# Patient Record
Sex: Female | Born: 1965 | Race: White | Hispanic: No | Marital: Married | Smoking: Never smoker
Health system: Southern US, Community
[De-identification: ages and names within clinical notes are randomized; demographics above are authoritative.]

## PROBLEM LIST (undated history)

## (undated) DIAGNOSIS — J45909 Unspecified asthma, uncomplicated: Secondary | ICD-10-CM

## (undated) DIAGNOSIS — K5792 Diverticulitis of intestine, part unspecified, without perforation or abscess without bleeding: Secondary | ICD-10-CM

## (undated) HISTORY — PX: DILATION AND CURETTAGE OF UTERUS: SHX78

## (undated) HISTORY — PX: CHOLECYSTECTOMY: SHX55

---

## 2001-01-23 ENCOUNTER — Other Ambulatory Visit: Admission: RE | Admit: 2001-01-23 | Discharge: 2001-01-23 | Payer: Self-pay | Admitting: Obstetrics and Gynecology

## 2001-06-08 ENCOUNTER — Encounter: Payer: Self-pay | Admitting: Obstetrics and Gynecology

## 2001-06-08 ENCOUNTER — Inpatient Hospital Stay (HOSPITAL_COMMUNITY): Admission: AD | Admit: 2001-06-08 | Discharge: 2001-06-08 | Payer: Self-pay | Admitting: Obstetrics and Gynecology

## 2001-06-12 ENCOUNTER — Inpatient Hospital Stay (HOSPITAL_COMMUNITY): Admission: AD | Admit: 2001-06-12 | Discharge: 2001-06-12 | Payer: Self-pay | Admitting: Obstetrics & Gynecology

## 2001-06-19 ENCOUNTER — Encounter: Payer: Self-pay | Admitting: Obstetrics and Gynecology

## 2001-06-19 ENCOUNTER — Inpatient Hospital Stay (HOSPITAL_COMMUNITY): Admission: AD | Admit: 2001-06-19 | Discharge: 2001-06-19 | Payer: Self-pay | Admitting: Obstetrics and Gynecology

## 2001-06-19 ENCOUNTER — Encounter (HOSPITAL_COMMUNITY): Admission: RE | Admit: 2001-06-19 | Discharge: 2001-07-14 | Payer: Self-pay | Admitting: Obstetrics and Gynecology

## 2001-07-15 ENCOUNTER — Encounter (INDEPENDENT_AMBULATORY_CARE_PROVIDER_SITE_OTHER): Payer: Self-pay | Admitting: Specialist

## 2001-07-15 ENCOUNTER — Inpatient Hospital Stay (HOSPITAL_COMMUNITY): Admission: AD | Admit: 2001-07-15 | Discharge: 2001-07-18 | Payer: Self-pay | Admitting: Obstetrics and Gynecology

## 2001-08-15 ENCOUNTER — Other Ambulatory Visit: Admission: RE | Admit: 2001-08-15 | Discharge: 2001-08-15 | Payer: Self-pay | Admitting: Obstetrics and Gynecology

## 2001-10-13 ENCOUNTER — Ambulatory Visit (HOSPITAL_COMMUNITY): Admission: RE | Admit: 2001-10-13 | Discharge: 2001-10-13 | Payer: Self-pay | Admitting: Obstetrics and Gynecology

## 2001-10-13 ENCOUNTER — Encounter: Payer: Self-pay | Admitting: Obstetrics and Gynecology

## 2003-02-05 ENCOUNTER — Other Ambulatory Visit: Admission: RE | Admit: 2003-02-05 | Discharge: 2003-02-05 | Payer: Self-pay | Admitting: Obstetrics and Gynecology

## 2005-02-26 ENCOUNTER — Other Ambulatory Visit: Admission: RE | Admit: 2005-02-26 | Discharge: 2005-02-26 | Payer: Self-pay | Admitting: Obstetrics and Gynecology

## 2008-07-11 ENCOUNTER — Ambulatory Visit: Payer: Self-pay | Admitting: Family Medicine

## 2008-07-11 DIAGNOSIS — E785 Hyperlipidemia, unspecified: Secondary | ICD-10-CM | POA: Insufficient documentation

## 2008-07-11 DIAGNOSIS — R635 Abnormal weight gain: Secondary | ICD-10-CM | POA: Insufficient documentation

## 2008-07-11 DIAGNOSIS — N920 Excessive and frequent menstruation with regular cycle: Secondary | ICD-10-CM | POA: Insufficient documentation

## 2008-07-12 DIAGNOSIS — D509 Iron deficiency anemia, unspecified: Secondary | ICD-10-CM | POA: Insufficient documentation

## 2008-07-12 LAB — CONVERTED CEMR LAB
Basophils Absolute: 0 10*3/uL (ref 0.0–0.1)
Basophils Relative: 0 % (ref 0–1)
Eosinophils Absolute: 0.3 10*3/uL (ref 0.0–0.7)
Eosinophils Relative: 6 % — ABNORMAL HIGH (ref 0–5)
HCT: 37.2 % (ref 36.0–46.0)
Hemoglobin: 10.9 g/dL — ABNORMAL LOW (ref 12.0–15.0)
Lymphocytes Relative: 35 % (ref 12–46)
Lymphs Abs: 1.8 10*3/uL (ref 0.7–4.0)
MCHC: 29.3 g/dL — ABNORMAL LOW (ref 30.0–36.0)
MCV: 73.8 fL — ABNORMAL LOW (ref 78.0–100.0)
Monocytes Absolute: 0.3 10*3/uL (ref 0.1–1.0)
Monocytes Relative: 6 % (ref 3–12)
Neutro Abs: 2.7 10*3/uL (ref 1.7–7.7)
Neutrophils Relative %: 53 % (ref 43–77)
Platelets: 315 10*3/uL (ref 150–400)
RBC: 5.04 M/uL (ref 3.87–5.11)
RDW: 16.4 % — ABNORMAL HIGH (ref 11.5–15.5)
WBC: 5.2 10*3/uL (ref 4.0–10.5)

## 2009-05-02 ENCOUNTER — Encounter: Payer: Self-pay | Admitting: Family Medicine

## 2009-05-09 ENCOUNTER — Ambulatory Visit: Payer: Self-pay | Admitting: Family Medicine

## 2009-05-09 ENCOUNTER — Other Ambulatory Visit: Admission: RE | Admit: 2009-05-09 | Discharge: 2009-05-09 | Payer: Self-pay | Admitting: Family Medicine

## 2009-05-26 ENCOUNTER — Encounter: Admission: RE | Admit: 2009-05-26 | Discharge: 2009-05-26 | Payer: Self-pay | Admitting: Family Medicine

## 2010-04-19 LAB — CONVERTED CEMR LAB: Pap Smear: NEGATIVE

## 2010-04-21 NOTE — Assessment & Plan Note (Signed)
Summary: CPE with pap   Vital Signs:  Patient profile:   45 year old female Height:      65.5 inches Weight:      196 pounds BMI:     32.24 O2 Sat:      96 % on Room air Temp:     97.8 degrees F oral Pulse rate:   81 / minute BP sitting:   125 / 83  (right arm) Cuff size:   large  Vitals Entered By: Payton Spark CMA/April (May 09, 2009 9:24 AM)  O2 Flow:  Room air CC: CPE w/pap   Primary Care Provider:  Seymour Bars DO  CC:  CPE w/pap.  History of Present Illness: 45 yo WF presents for CPE with pap smear.  Last year, we started her on Seasonique.  Her first period was heavy on it.  The 2nd one was OK.  She is off the [place] and has not had a period since October.  She is still iron deficient.  She took RX iron for 3 mos last year but failed to f/u.  She has hx of irregular menses with heavy menses in the interim.  It was thought that she had PCOS a  few years ago.  She is interested in going back on OCPs to regulate her cycle.  She is not having vasomotor flushing.    Monogamous, married with 3 kids.  Works as a IT trainer.        Current Medications (verified): 1)  Seasonale 0.15-0.03 Mg Tabs (Levonorgest-Eth Estrad 91-Day) .Marland Kitchen.. 1 Tab By Mouth Daily As Directed 2)  Integra Plus  Caps (Fefum-Fepoly-Fa-B Cmp-C-Biot) .Marland Kitchen.. 1 Cap By Mouth Daily  Allergies (verified): No Known Drug Allergies  Past History:  Past Medical History: Reviewed history from 07/11/2008 and no changes required. [id number] Dyslipidemia Obesity  Past Surgical History: Reviewed history from 07/11/2008 and no changes required. D&E after 2nd trimester miscarriage in 2001. LTCS   Family History: Reviewed history from 07/11/2008 and no changes required. father, alive AMI in his 43s.  dyslipidemia.  HTN Paternal uncles, GM, GF heart dz < 65 mom healthy 2 sisters healthy uncles, GF DM  Social History: Reviewed history from 07/11/2008 and no changes required. IT trainer, corporate, self-  employed. Married to [place].  Has 3 kids, 19 yo son and twin 56 yo daughters. Never smoked. Denies ETOH. Walks 30 min 2 x a wk. Fair diet.    Review of Systems  The patient denies anorexia, fever, weight loss, weight gain, vision loss, decreased hearing, hoarseness, chest pain, syncope, dyspnea on exertion, peripheral edema, prolonged cough, headaches, hemoptysis, abdominal pain, melena, hematochezia, severe indigestion/heartburn, hematuria, incontinence, genital sores, muscle weakness, suspicious skin lesions, transient blindness, difficulty walking, depression, unusual weight change, abnormal bleeding, enlarged lymph nodes, angioedema, breast masses, and testicular masses.    Physical Exam  General:  alert, well-developed, well-nourished, and well-hydrated.  obesity Head:  normocephalic and atraumatic.   Eyes:  PERRLA Ears:  EACs patent; TMs translucent and gray with good cone of light and bony landmarks.  Nose:  no nasal discharge.   Mouth:  good dentition and pharynx pink and moist.   Neck:  no masses.   Breasts:  No mass, nodules, thickening, tenderness, bulging, retraction, inflamation, nipple discharge or skin changes noted.   Lungs:  Normal respiratory effort, chest expands symmetrically. Lungs are clear to auscultation, no crackles or wheezes. Heart:  Normal rate and regular rhythm. S1 and S2 normal without gallop, murmur, click, rub or  other extra sounds. Abdomen:  Bowel sounds positive,abdomen soft and non-tender without masses, organomegaly Genitalia:  Pelvic Exam:        External: normal female genitalia without lesions or masses        Vagina: normal without lesions or masses        Cervix: normal without lesions or masses        Adnexa: normal bimanual exam without masses or fullness        Uterus: normal by palpation        Pap smear: performed Pulses:  2+ radial and pedal pulses Extremities:  no E/C/C Skin:  color normal.   Cervical Nodes:  No lymphadenopathy  noted Psych:  good eye contact, not anxious appearing, and not depressed appearing.     Impression & Recommendations:  Problem # 1:  ROUTINE GYNECOLOGICAL EXAMINATION (ICD-V72.31) Thin prep pap smear done.  F/U results Tuesday. Keeping healthy checklist for women reviewed.  BP at goal.  BMI 32 c/w class I obesity. Reviewed labs from 05-02-09 and she has a normal fasting glucose, normal cholesterol and a low iron with normal Hgb. Will add RX iron back on along with OCPs to regulate irreg heavy periods likely with PCOS. She is going to work on [employer], regular exercise, wt loss.   She is taking Calcium with Vit D two times a day.   EKG today for + fam hx of premature heart dz:  NSR at 64 bpm w/ no ischemia, normal axis. Set up cardiac stress test for + fam hx. Tdap today. Mammogram order given.    Complete Medication List: 1)  Seasonale 0.15-0.03 Mg Tabs (Levonorgest-eth estrad 91-day) .Marland Kitchen.. 1 tab by mouth daily as directed 2)  Integra Plus Caps (Fefum-fepoly-fa-b cmp-c-biot) .Marland Kitchen.. 1 cap by mouth daily  Other Orders: EKG w/ Interpretation (93000) T-Mammography Bilateral Screening (62952) Cardiology Referral (Cardiology) Tdap => 42yrs IM (84132) Admin 1st Vaccine (44010) Admin 1st Vaccine [facility]) [phone])  Patient Instructions: 1)  Have mammogram done downstairs. 2)  Will set up exercise treadmill for + fam hx of premature heart dz. 3)  Start RX iron once daily + Women's MVI daily. 4)  Recheck iron levels with LH/FSH/ TSH in 3 mos. 5)  Work on [employer], regular exercise.   Prescriptions: SEASONALE 0.15-0.03 MG TABS (LEVONORGEST-ETH ESTRAD 91-DAY) 1 tab by mouth daily as directed  #3 month pack x 4   Entered and Authorized by:   Seymour Bars DO   Signed by:   Seymour Bars DO on 05/09/2009   Method used:   Electronically to        [facility]* (retail)       [address] [place], [place]  [address]       Ph: [phone]       Fax: [phone]   RxID:    [id number] INTEGRA PLUS  CAPS (FEFUM-FEPOLY-FA-B CMP-C-BIOT) 1 cap by mouth daily Brand medically necessary #30 x 2   Entered and Authorized by:   Seymour Bars DO   Signed by:   Seymour Bars DO on 05/09/2009   Method used:   Electronically to        [facility]* (retail)       [address] [place], [place]  [address]       Ph: [phone]       Fax: [phone]   RxID:   [id number]  Tetanus/Td Vaccine    Vaccine Type: Tdap    Site: left deltoid    Dose: 0.5 ml    Route: IM    Given by: Payton Spark CMA    Exp. Date: 01/15/2010    Lot #: [id number]    VIS given: 02/07/07 version given May 09, 2009.  Appended Document: CPE with pap Pls let pt know that I change her order for stress testing from a nuclear stress test to just a treadmill stress test due to insurance coverage.  This should be adequate and less expensive.  Seymour Bars, D.O.  Appended Document: CPE with pap LMOM for Pt to CB.Arvilla Market CMA, Michelle May 28, 2009 2:39 PM   Pt aware.Arvilla Market CMA, Michelle May 29, 2009 8:35 AM

## 2010-08-07 NOTE — Op Note (Signed)
[facility]  Patient:    Emma Alvarez, Emma Alvarez Visit Number: [id number] MRN: [id number]          Service Type: OBS Location: 910A 9143 01 Attending Physician:  Frederich Balding Dictated by:   Juluis Mire, M.D. Proc. Date: 07/15/01 Admit Date:  07/15/2001                             Operative Report  PREOPERATIVE DIAGNOSES:       1. Twin pregnancy at 37 weeks with vertex breech                                  presentation.                               2. Increased blood pressure.                               3. Mild oligohydramnios.  POSTOPERATIVE DIAGNOSES:      1. Twin pregnancy at 37 weeks with vertex                                  presentation.                               2. Increased blood pressure.                               3. Mild oligohydramnios.  OPERATION:                    Low transverse cesarean section.  SURGEON:                      Juluis Mire, M.D.  ANESTHESIA:                   Epidural.  ESTIMATED BLOOD LOSS:         800 cc.  PACKS AND DRAINS:             None.  BLOOD REPLACED:               None.  COMPLICATIONS:                None.  INDICATIONS:                  Dictated in the history and physical.  DESCRIPTION OF PROCEDURE:     The patient was taken to the operating room and placed in the supine position with left lateral tilt.  After a satisfactory level of epidural anesthesia was obtained, the abdomen was prepped out with Betadine and draped in a sterile field.  A low transverse skin incision was made with the knife and carried to the subcutaneous tissue.  The fascia was entered sharply and incision in the fascia extended laterally.  The fascia was taken off of the muscle superiorly and inferiorly.  Rectus muscles were separated in the midline.  The peritoneum was entered sharply.  Incision into the peritoneum extended both superiorly and inferiorly.  A low transverse bladder flap was  developed.  A low transverse uterine incision was begun with the knife and extended laterally using manual traction.  Twin A presented in the breech presentation and delivered in the usual manner.  Twin A was a viable female who weighed 6 pounds 8 ounces.  Apgars were 8/9.  Umbilical artery pH was 7.31.  Twin B presented in the vertex presentation and was delivered with elevation of the head and fundal pressure.  Twin B was a viable female who weighed 7 pounds 3 ounces.  Apgars were 8/9.  Umbilical cord pH was 7.32.  The placenta was then delivered manually.  The uterus was wiped free of remaining membranes and placenta. The uterus was closed in two running locking suture of 0 chromic using a two layer closure technique.  Good hemostasis was noted.  Tubes and ovaries were unremarkable.  Muscle and peritoneum was closed with a running suture of 3-0 Vicryl.  The fascia was closed with a running suture of 0 PDS.  Skin was closed with staples and Steri-Strips.  Sponge, needle and instrument counts were reported as correct by the circulating nurse x2.  Foley catheter remained clear at the time of closure.  The patient did tolerate the procedure well and was returned to the recovery room in good condition. Dictated by:   Juluis Mire, M.D. Attending Physician:  Frederich Balding DD:  07/15/01 TD:  07/16/01 Job: [id number]

## 2010-08-07 NOTE — H&P (Signed)
[facility]  Patient:    Emma Alvarez, Emma Alvarez Visit Number: [id number] MRN: [id number]          Service Type: OBS Location: 910A 9143 01 Attending Physician:  Frederich Balding Dictated by:   Juluis Mire, M.D. Admit Date:  07/15/2001                           History and Physical  HISTORY OF PRESENT ILLNESS:   This is a 45 year old, gravida 3, para 1-0-1-1, married white female who presents for primary cesarean section for management of twin gestations. Last menstrual period was August 4 giving her an estimated date of confinement of May 18. This gives her an estimated gestational age of [redacted] weeks 6 days. This is consistent with initial ultrasounds.  The patients prenatal course has been complicated by twin gestations. Recently, she has had rising mean arterial blood pressures of 130s from mid 80s. The infants have been in a transverse lie presentation. We attempted amniocentesis yesterday, April 25, to document lung maturity but due to a large anterior placenta and slightly decreased fluid, we were unable to perform the amnio. Due to the increase mean arterial pressure, the slightly decreased amniotic fluid and fetal position, the patient presents now for primary cesarean section for management. We have discussed the low but still potential risk of fetal lung immaturity and its management.  Prenatal course was also complicated by advanced maternal age. The patient was given the option of genetic evaluation, which she did decline.  ALLERGIES:                    PENICILLIN.  MEDICATIONS:                  Prenatal vitamins.  PRENATAL LABORATORIES:        Patients blood type is O positive. She had a negative antibody screen, nonreactive serology, positive rubella titer, negative hepatitis B surface antigen, group B strep was negative, 50-gram glucola was within normal limits.  For past medical history, family history, and social history please  see prenatal records.  REVIEW OF SYSTEMS:            Noncontributory.  PHYSICAL EXAMINATION:  VITAL SIGNS:                  Patient is afebrile with stable vital signs.  HEENT:                        Normocephalic. Pupils equal, round, reactive to light and accommodation. Extraocular movements are intact. Sclerae and conjunctivae clear. Oropharynx clear.  NECK:                         Without thyromegaly.  BREASTS:                      No masses noted but very glandular.  LUNGS:                        Clear.  CARDIOVASCULAR:               Regular rhythm and rate with a grade 2/6 systolic ejection murmur. No clicks or gallops.  ABDOMEN:                      Gravid uterus. Ultrasound  confirmed a vertex/breech presentation, a large anterior placenta. Fetal heart tones are audible. There is no mass or organomegaly.  PELVIC:                       Cervix is long and closed.  EXTREMITIES:                  Trace edema.  NEUROLOGIC:                   Grossly within normal limits.  IMPRESSION:                   1. Twin gestations at 37 weeks with a                                  vertex/breech presentation.                               2. Slightly elevated mean arterial pressure.                               3. Slightly decreased amniotic fluid.  PLAN:                         The patient is to undergo primary cesarean section. The risks were discussed including the risk of anesthesia, the risk of infection, the risk of hemorrhage that could necessitate transfusion with the risk of AIDS or hepatitis, the risk of injury to adjacent organs including bladder, bowel or ureters that could require further exploratory surgery, the risk of deep venous thrombosis and pulmonary embolus. The patient appears to understand indications and risks. Dictated by:   Juluis Mire, M.D. Attending Physician:  Frederich Balding DD:  07/15/01 TD:  07/16/01 Job: [id number]

## 2010-08-07 NOTE — Discharge Summary (Signed)
[facility]  Patient:    Emma Alvarez, Emma Alvarez Visit Number: [id number] MRN: [id number]          Service Type: OBS Location: 910A 9143 01 Attending Physician:  Frederich Balding Dictated by:   Julio Sicks, N.P. Admit Date:  07/15/2001 Discharge Date: 07/18/2001                             Discharge Summary  ADMITTING DIAGNOSES:          1. Twin gestation at 40 weeks with a                                  vertex/breech presentation.                               2. Slightly elevated main arterial pressure.                               3. Mild oligohydramnios.  DISCHARGE DIAGNOSIS:          Status post low transverse cesarean delivery with viable female twins.  PROCEDURE:                    Primary low transverse cesarean section.  REASON FOR ADMISSION:         Please see dictated H&P.  HOSPITAL COURSE:              The patient was admitted to [facility] for the above-named procedure.  Low transverse cesarean delivery was performed without complication.  Baby A, a female, was delivered weighing 6 pounds 8 ounces with Apgars of 8 at one minute and 9 at five minutes.  Cord pH was 7.31.  Baby B, a female infant, was delivered weighing 7 pounds 3 ounces, with Apgars of 8 at one minute and 9 at five minutes.  Cord pH was 7.32.  The patient tolerated the procedure well and was taken to the recovery room.  On postoperative day one, the patient had good return of bowel function.  Abdominal dressing was clean, dry, and intact.  Fundus was firm, and labs revealed a hemoglobin of 7.61, hematocrit of 23.0, and WBC count of 7.1.  On postoperative day two, the patient was ambulating without assistance. The patient did complain of decreased sensation in her right lower extremity. Lower extremity was symmetrical without warmth or erythema noted in the right leg.  Anesthesia was consulted.  She was tolerating a regular diet without complaints  of nausea or vomiting.  On postoperative day three, the patient continued to complain of right paresthesia in the right lower leg and the inner groin.  She was ambulating without assistance.  Fundus remained firm. Incision was clean, dry, and intact.  Staples were removed, and the patient was discharged home.  CONDITION ON DISCHARGE:       Good.  DIET:                         Regular as tolerated.  ACTIVITY:                     No heavy lifting.  No driving x 2 weeks.  No vaginal entry.  FOLLOW-UP:  The patient is to follow up in the office in 1-2 weeks for an incision check.  She is to call for a temperature greater than 100 degrees, persistent nausea and vomiting, heavy vaginal bleeding, and/or redness or drainage from the incision site.  DISCHARGE MEDICATIONS:        1. Motrin 600 mg q.6h. over-the-counter.                               2. Percocet #40, 1-2 q.4-6h. p.r.n. pain.                               3. Niferex 160 mg tabs, dispense 30, 1 q.d. with                                  1 additional refill.                               4. Prenatal vitamins 1 p.o. q.d. Dictated by:   Julio Sicks, N.P. Attending Physician:  Frederich Balding DD:  08/03/01 TD:  08/06/01 Job: [id number]

## 2010-11-11 ENCOUNTER — Encounter: Payer: Self-pay | Admitting: Family Medicine

## 2010-11-11 ENCOUNTER — Ambulatory Visit (INDEPENDENT_AMBULATORY_CARE_PROVIDER_SITE_OTHER): Payer: BC Managed Care – PPO | Admitting: Family Medicine

## 2010-11-11 DIAGNOSIS — J019 Acute sinusitis, unspecified: Secondary | ICD-10-CM

## 2010-11-11 DIAGNOSIS — N920 Excessive and frequent menstruation with regular cycle: Secondary | ICD-10-CM

## 2010-11-11 MED ORDER — AZITHROMYCIN 250 MG PO TABS: ORAL_TABLET | ORAL | Status: AC

## 2010-11-11 MED ORDER — LEVONORGEST-ETH ESTRAD 91-DAY 0.15-0.03 MG PO TABS: 1.0000 | ORAL_TABLET | Freq: Every day | ORAL | Status: DC

## 2010-11-11 NOTE — Patient Instructions (Signed)
Take Zithromax x 5 days for sinusitis.  Take OTC Claritin D 24 hr once daily for congestion.  Use advil as needed for aches/ pains.  Start Seasonale tonight.  Call if any problems.

## 2010-11-11 NOTE — Progress Notes (Signed)
  Subjective:    Patient ID: Emma Alvarez, female    DOB: [birth year], 45 y.o.   MRN: [id number]  HPI  45 yo WF presents for chronic head congestion and rhinorrhea.  She swam last wk and that seemed to make her sinus pressure worse.  She had been congested for the prior month.  She has allergies and usually takes Claritin.  She wasn't taking anything recently.  She feels tired and has a bad headache.  Has postnasal drip ith cough and rhinorrhea.  She has also had a heavy period for the past 10 days.  She had been off OCPs in October and was having normal cycles until this one.  She is interested in going back on OCPs.    BP 126/83  Pulse 86  Temp(Src) 97.9 F (36.6 C) (Oral)  Ht 5\' 7"  (1.702 m)  Wt 209 lb (94.802 kg)  BMI 32.73 kg/m2  SpO2 97%    Review of Systems  Constitutional: Positive for fatigue. Negative for fever and chills.  HENT: Positive for congestion, sore throat, rhinorrhea, sneezing, postnasal drip and sinus pressure.   Eyes: Negative for visual disturbance.  Respiratory: Negative for shortness of breath.   Cardiovascular: Negative for chest pain.  Genitourinary: Positive for vaginal bleeding.  Neurological: Positive for headaches.       Objective:   Physical Exam  Constitutional: She appears well-developed and well-nourished. No distress.       obese  HENT:  Right Ear: External ear normal.  Left Ear: External ear normal.  Mouth/Throat: Oropharynx is clear and moist.       Maxillary sinuses TTP Nasal congestion, clear rhinorrhea, boggy turbinates, mild o/p injection  Eyes: Conjunctivae are normal.  Neck: Neck supple.  Cardiovascular: Normal rate, regular rhythm and normal heart sounds.   Pulmonary/Chest: Effort normal and breath sounds normal. No respiratory distress. She has no wheezes.  Lymphadenopathy:    She has cervical adenopathy.  Skin: No pallor.          Assessment & Plan:

## 2010-11-12 ENCOUNTER — Encounter: Payer: Self-pay | Admitting: Family Medicine

## 2010-11-12 DIAGNOSIS — J019 Acute sinusitis, unspecified: Secondary | ICD-10-CM | POA: Insufficient documentation

## 2010-11-12 NOTE — Assessment & Plan Note (Signed)
Secondary to weeks of head congestion, on suboptimal allergy treatment preceeding.  Will start antibiotics today.  Start claritin D and use advil for aches and pains.  Call if not improved in 10 days.

## 2010-11-12 NOTE — Assessment & Plan Note (Signed)
Heavy menses returned off OCPs and she is still premenopausal.  Was not having any problems on the birth control pill in the past, so will restart Seasonale today.

## 2011-01-14 IMAGING — MG MM DIGITAL SCREENING BILAT W/ CAD
4 series · 4 of 4 positions shown · non-contrast
Comparison: none

DG SCREEN MAMMOGRAM BILATERAL
Bilateral CC and MLO view(s) were taken.
Technologist: Isaaks Ezile

DIGITAL SCREENING MAMMOGRAM WITH CAD
The breast tissue is extremely dense.  No masses or malignant type calcifications are identified.
Images were processed with CAD.

[R CC]
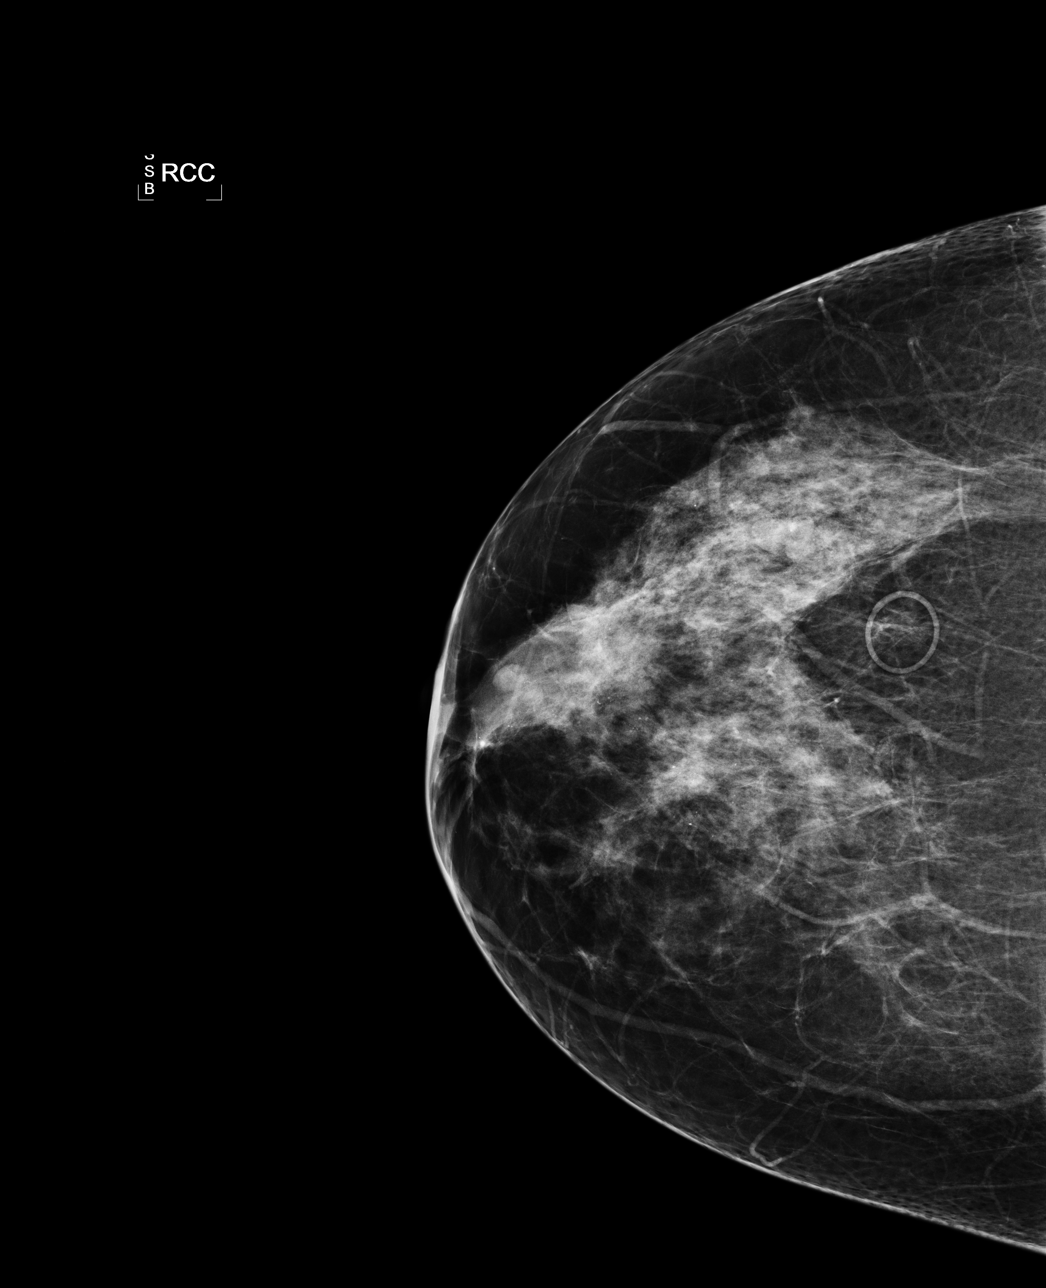

[L CC]
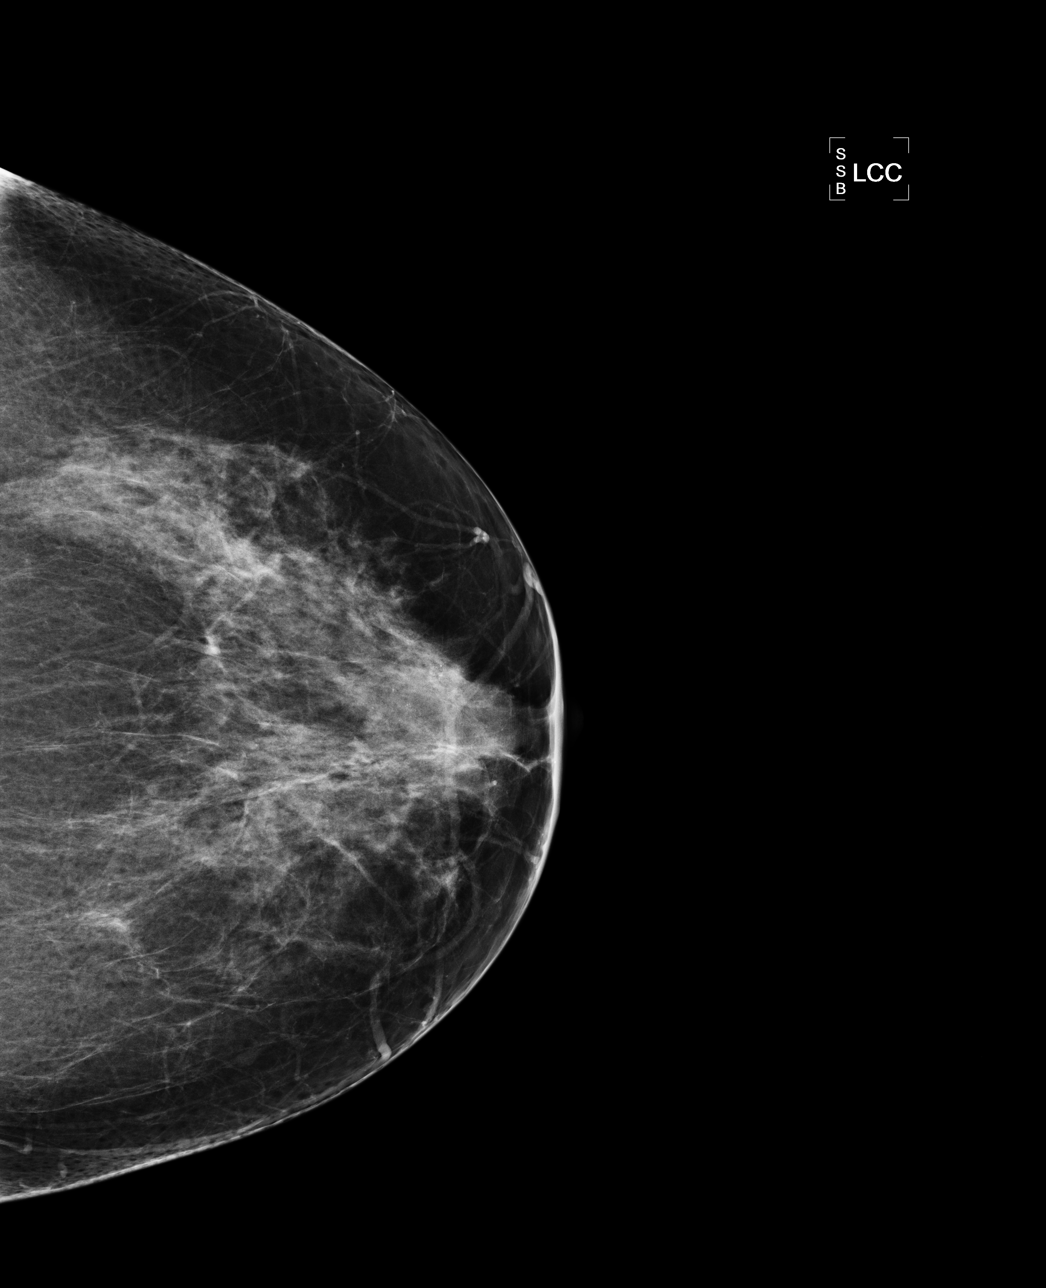

[L MLO]
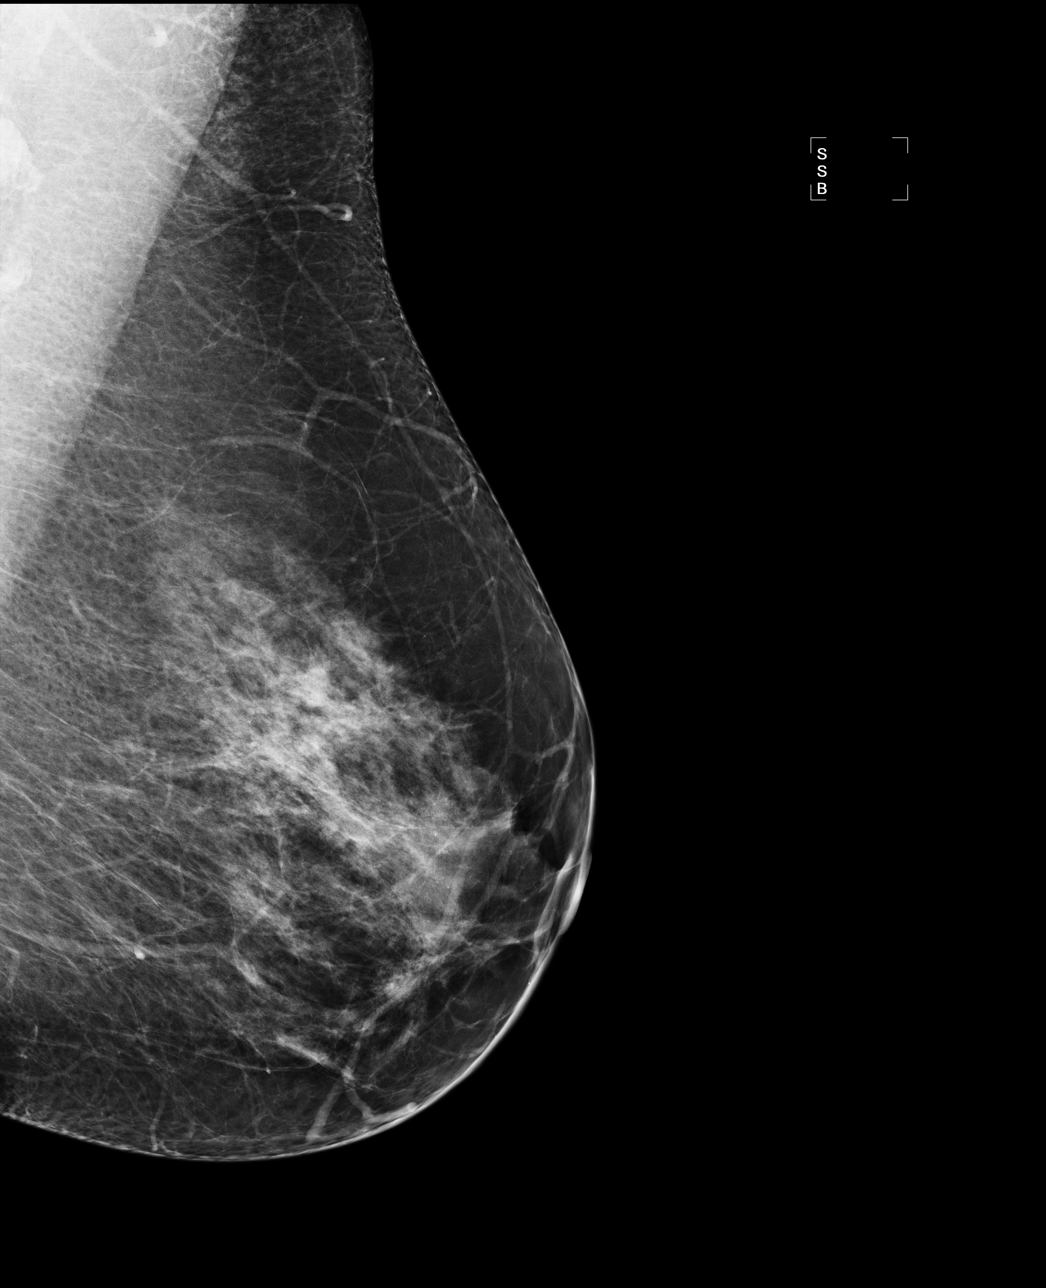

[R MLO]
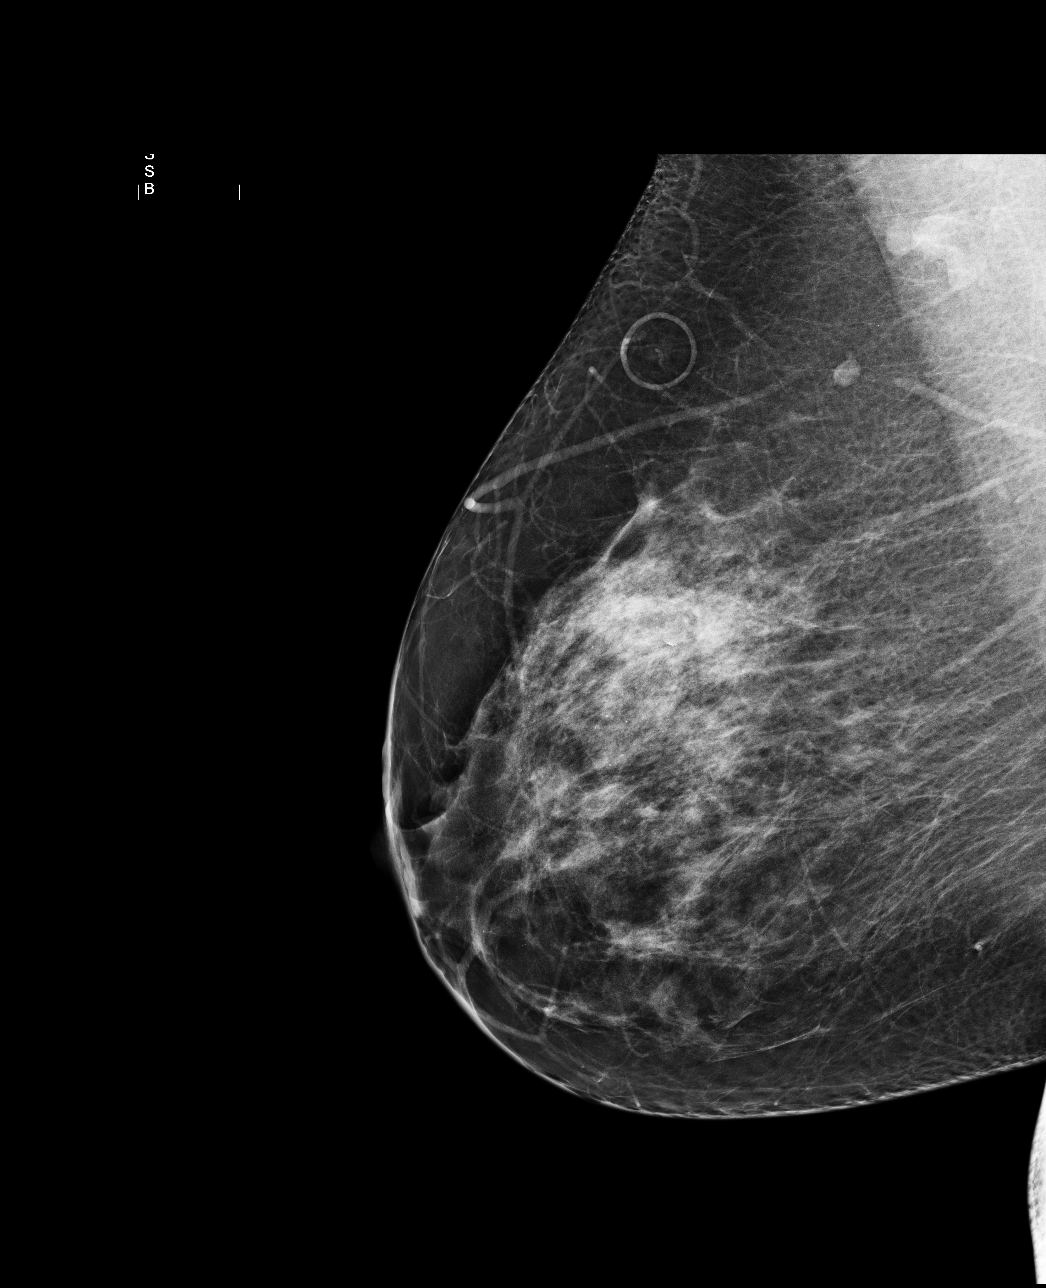

[4 of 4 positions shown; findings below may reference images not displayed]

IMPRESSION: No specific mammographic evidence of malignancy.  Next screening mammogram is recommended in one 
year.

A result letter of this screening mammogram will be mailed directly to the patient.

ASSESSMENT: Negative - BI-RADS 1

Screening mammogram in 1 year.
,

## 2012-03-01 ENCOUNTER — Emergency Department
Admission: EM | Admit: 2012-03-01 | Discharge: 2012-03-01 | Disposition: A | Discharge status: Home / Self Care | Payer: BC Managed Care – PPO | Source: Home / Self Care | Attending: Family Medicine | Admitting: Family Medicine

## 2012-03-01 ENCOUNTER — Encounter: Payer: Self-pay | Admitting: *Deleted

## 2012-03-01 DIAGNOSIS — H60399 Other infective otitis externa, unspecified ear: Secondary | ICD-10-CM

## 2012-03-01 DIAGNOSIS — H6091 Unspecified otitis externa, right ear: Secondary | ICD-10-CM

## 2012-03-01 HISTORY — DX: Unspecified asthma, uncomplicated: J45.909

## 2012-03-01 MED ORDER — HYDROCORTISONE-ACETIC ACID 1-2 % OT SOLN: OTIC | Status: DC

## 2012-03-01 NOTE — ED Notes (Signed)
Patient c/o right ear itching x 1 month. She started applying neosporin about 1/week. Over the past 3 days she developed pain and a possible black wax.

## 2012-03-01 NOTE — ED Provider Notes (Signed)
History     CSN: [id number]  Arrival date & time 03/01/12  1618   First MD Initiated Contact with Patient 03/01/12 1652      Chief Complaint  Patient presents with  . Otalgia      HPI Comments: Patient c/o right ear itching x 1 month. She started applying neosporin about 1/week. Over the past 3 days she developed pain and a possible black wax.   Patient is a 46 y.o. female presenting with ear pain. The history is provided by the patient.  Otalgia This is a new problem. Episode onset: 1 month. There is pain in the right ear. The problem occurs constantly. The problem has been gradually worsening. There has been no fever. The patient is experiencing no pain. Associated symptoms include ear discharge. Pertinent negatives include no headaches, no hearing loss, no rhinorrhea, no sore throat, no abdominal pain, no diarrhea, no vomiting, no neck pain, no cough and no rash.    Past Medical History  Diagnosis Date  . Asthma     Past Surgical History  Procedure Date  . Cholecystectomy   . Cesarean section   . Dilation and curettage of uterus     No family history on file.  History  Substance Use Topics  . Smoking status: Never Smoker   . Smokeless tobacco: Never Used  . Alcohol Use: No    OB History    Grav Para Term Preterm Abortions TAB SAB Ect Mult Living                  Review of Systems  HENT: Positive for ear pain and ear discharge. Negative for hearing loss, sore throat, rhinorrhea and neck pain.   Respiratory: Negative for cough.   Gastrointestinal: Negative for vomiting, abdominal pain and diarrhea.  Skin: Negative for rash.  Neurological: Negative for headaches.  All other systems reviewed and are negative.    Allergies  Penicillins  Home Medications   Current Outpatient Rx  Name  Route  Sig  Dispense  Refill  . ALBUTEROL SULFATE (2.5 MG/3ML) 0.083% IN NEBU   Nebulization   Take 2.5 mg by nebulization every 6 (six) hours as needed.         Marland Kitchen  HYDROCORTISONE-ACETIC ACID 1-2 % OT SOLN      Place 6 drops in the right ear twice daily   10 mL   0   . LEVONORGEST-ETH ESTRAD 91-DAY 0.15-0.03 MG PO TABS   Oral   Take 1 tablet by mouth daily.   1 Package   3     BP 131/84  Pulse 77  Temp 98 F (36.7 C) (Oral)  Resp 14  Ht 5' 6.5" (1.689 m)  Wt 210 lb (95.255 kg)  BMI 33.39 kg/m2  SpO2 97%  Physical Exam Nursing notes and Vital Signs reviewed. Appearance:  Patient appears stated age, and in no acute distress.  Patient is obese (BMI 33.4) Eyes:  Pupils are equal, round, and reactive to light and accomodation.  Extraocular movement is intact.  Conjunctivae are not inflamed  Ears:   Left canal and tympanic membrane normal.  Right canal has moist whitish debris present; unable to visualize right tympanic membrane  Nose:  Normal.  No sinus tenderness.   Pharynx:  Normal Neck:  Supple.  No adenopathy. Skin:  No rash present.   ED Course  Procedures  Right ear lavage:  Post lavage, small amount of moist white debris remains;  tympanic membrane appears normal.  Mild erythema canal but no swelling.      1. Otitis externa of right ear       MDM  Begin VoSol HC drops for one week.  May use in left ear also for 2 to 3 days. Return in 5 days for repeat ear lavage        Lattie Haw, MD 03/03/12 1134

## 2012-05-10 DIAGNOSIS — J4520 Mild intermittent asthma, uncomplicated: Secondary | ICD-10-CM | POA: Insufficient documentation

## 2012-05-10 DIAGNOSIS — J3089 Other allergic rhinitis: Secondary | ICD-10-CM | POA: Insufficient documentation

## 2018-07-14 DIAGNOSIS — E669 Obesity, unspecified: Secondary | ICD-10-CM | POA: Insufficient documentation

## 2019-09-23 ENCOUNTER — Emergency Department
Admission: RE | Admit: 2019-09-23 | Discharge: 2019-09-23 | Disposition: A | Discharge status: Home / Self Care | Payer: BLUE CROSS/BLUE SHIELD | Source: Ambulatory Visit | Attending: Family Medicine | Admitting: Family Medicine

## 2019-09-23 ENCOUNTER — Other Ambulatory Visit: Payer: Self-pay

## 2019-09-23 VITALS — BP 132/80 | HR 75 | Temp 98.7°F | Resp 17 | Wt 200.0 lb

## 2019-09-23 DIAGNOSIS — R1032 Left lower quadrant pain: Secondary | ICD-10-CM

## 2019-09-23 DIAGNOSIS — R300 Dysuria: Secondary | ICD-10-CM | POA: Diagnosis not present

## 2019-09-23 HISTORY — DX: Diverticulitis of intestine, part unspecified, without perforation or abscess without bleeding: K57.92

## 2019-09-23 LAB — POCT URINALYSIS DIP (MANUAL ENTRY)
Bilirubin, UA: NEGATIVE
Glucose, UA: NEGATIVE mg/dL
Ketones, POC UA: NEGATIVE mg/dL
Leukocytes, UA: NEGATIVE
Nitrite, UA: NEGATIVE
Protein Ur, POC: NEGATIVE mg/dL
Spec Grav, UA: 1.015 (ref 1.010–1.025)
Urobilinogen, UA: 0.2 E.U./dL
pH, UA: 5.5 (ref 5.0–8.0)

## 2019-09-23 LAB — POCT CBC W AUTO DIFF (K'VILLE URGENT CARE)

## 2019-09-23 MED ORDER — METRONIDAZOLE 500 MG PO TABS
ORAL_TABLET | ORAL | 0 refills | Status: DC
Start: 2019-09-23 — End: 2021-08-17

## 2019-09-23 MED ORDER — CIPROFLOXACIN HCL 500 MG PO TABS
ORAL_TABLET | ORAL | 0 refills | Status: DC
Start: 2019-09-23 — End: 2021-08-17

## 2019-09-23 NOTE — ED Provider Notes (Signed)
[facility]    CSN: [id number] Arrival date & time: 09/23/19  1142      History   Chief Complaint Chief Complaint  Patient presents with   Appointment   Abdominal Pain    HPI Emma Alvarez is a 54 y.o. female.   Patient returned home 1.5 weeks ago after a 10 [place]  She admits that she did not drink enough fluids and probably became dehydrated at times.  During the past two weeks she has had vague lower abdominal pain and cramping.  She has had mild dysuria without frequency, and denies nausea/vomiting or changes in bowel movements although she feels mildly constipated.  She denies fevers, chills, and sweats. She had a screening colonoscopy 5 years ago that showed diverticulosis (no polyps).   The history is provided by the patient.  Abdominal Pain Pain location:  LLQ and RLQ Pain quality: aching and bloating   Pain radiates to:  Does not radiate Pain severity:  Mild Onset quality:  Gradual Timing:  Constant Progression:  Worsening Chronicity:  New Context: eating, previous surgery and recent travel   Context: not alcohol use, not awakening from sleep, not diet changes, not recent illness, not sick contacts, not suspicious food intake and not trauma   Relieved by:  Nothing Worsened by:  Movement and palpation Ineffective treatments:  NSAIDs Associated symptoms: constipation and dysuria   Associated symptoms: no anorexia, no belching, no chest pain, no chills, no cough, no diarrhea, no fatigue, no fever, no flatus, no hematemesis, no hematochezia, no hematuria, no melena, no nausea, no shortness of breath, no sore throat, no vaginal bleeding and no vomiting     Past Medical History:  Diagnosis Date   Asthma    Diverticulitis     Patient Active Problem List   Diagnosis Date Noted   Obesity (BMI 30-39.9) 07/14/2018   Mild intermittent asthma 05/10/2012   Perennial allergic rhinitis 05/10/2012   Acute sinusitis  11/12/2010   ANEMIA, IRON DEFICIENCY 07/12/2008   DYSLIPIDEMIA 07/11/2008   MENORRHAGIA 07/11/2008   WEIGHT GAIN 07/11/2008    Past Surgical History:  Procedure Laterality Date   CESAREAN SECTION     CHOLECYSTECTOMY     DILATION AND CURETTAGE OF UTERUS      OB History   No obstetric history on file.      Home Medications    Prior to Admission medications   Medication Sig Start Date End Date Taking? Authorizing Provider  albuterol (PROVENTIL) (2.5 MG/3ML) 0.083% nebulizer solution Take 2.5 mg by nebulization every 6 (six) hours as needed.   Yes [provider]  beclomethasone (QVAR REDIHALER) 80 MCG/ACT inhaler INHALE ONE PUFF INTO THE LUNGS 2 (TWO) TIMES DAILY. 01/30/14  Yes [provider]  fluticasone (FLONASE) 50 MCG/ACT nasal spray Place 2 sprays into both nostrils daily. 01/30/14  Yes [provider]  loratadine (CLARITIN) 10 MG tablet Take by mouth.   Yes [provider]  acetic acid-hydrocortisone (VOSOL-HC) otic solution Place 6 drops in the right ear twice daily 03/01/12   Lattie Haw, MD  ciprofloxacin (CIPRO) 500 MG tablet Take one tab PO Q12hr 09/23/19   Lattie Haw, MD  levonorgestrel-ethinyl estradiol (SEASONALE) 0.15-0.03 MG tablet Take 1 tablet by mouth daily. 11/11/10 11/11/11  Bowen, Scot Jun, DO  metroNIDAZOLE (FLAGYL) 500 MG tablet Take one tab PO Q8hr 09/23/19   Lattie Haw, MD    Family History Family History  Problem Relation Age  of Onset   Healthy Mother    Heart failure Father    Healthy Sister    Healthy Sister     Social History Social History   Tobacco Use   Smoking status: Never Smoker   Smokeless tobacco: Never Used  Substance Use Topics   Alcohol use: No   Drug use: No     Allergies   Penicillins   Review of Systems Review of Systems  Constitutional: Negative for activity change, appetite change, chills, diaphoresis, fatigue, fever and unexpected weight change.    HENT: Negative for sore throat.   Respiratory: Negative for cough and shortness of breath.   Cardiovascular: Negative for chest pain.  Gastrointestinal: Positive for abdominal pain and constipation. Negative for abdominal distention, anorexia, blood in stool, diarrhea, flatus, hematemesis, hematochezia, melena, nausea and vomiting.  Genitourinary: Positive for dysuria. Negative for frequency, hematuria, pelvic pain, urgency and vaginal bleeding.  All other systems reviewed and are negative.    Physical Exam Triage Vital Signs ED Triage Vitals  Enc Vitals Group     BP 09/23/19 1227 132/80     Pulse Rate 09/23/19 1209 75     Resp 09/23/19 1209 17     Temp 09/23/19 1209 98.7 F (37.1 C)     Temp Source 09/23/19 1209 Oral     SpO2 09/23/19 1209 99 %     Weight 09/23/19 1208 200 lb (90.7 kg)     Height --      Head Circumference --      Peak Flow --      Pain Score 09/23/19 1207 3     Pain Loc --      Pain Edu? --      Excl. in [place]? --    No data found.  Updated Vital Signs BP 132/80 (BP Location: Right Arm) Comment: 2 attempts with dinamapp- rechecked manually after U/A obtained   Pulse 75    Temp 98.7 F (37.1 C) (Oral)    Resp 17    Wt 90.7 kg    SpO2 99%    BMI 31.80 kg/m   Visual Acuity Right Eye Distance:   Left Eye Distance:   Bilateral Distance:    Right Eye Near:   Left Eye Near:    Bilateral Near:     Physical Exam Vitals and nursing note reviewed.  Constitutional:      General: She is not in acute distress. HENT:     Head: Normocephalic.     Right Ear: External ear normal.     Left Ear: External ear normal.     Nose: Nose normal.     Mouth/Throat:     Pharynx: Oropharynx is clear.  Eyes:     Conjunctiva/sclera: Conjunctivae normal.     Pupils: Pupils are equal, round, and reactive to light.  Cardiovascular:     Heart sounds: Normal heart sounds.  Pulmonary:     Breath sounds: Normal breath sounds.  Abdominal:     General: Abdomen is protuberant.  Bowel sounds are increased. There is no distension.     Palpations: Abdomen is soft. There is no hepatomegaly, splenomegaly or mass.     Tenderness: There is abdominal tenderness in the left lower quadrant. There is no right CVA tenderness, left CVA tenderness or guarding. Negative signs include McBurney's sign.     Hernia: There is no hernia in the umbilical area or ventral area.    Musculoskeletal:     Cervical back: Neck supple.  Right lower leg: No edema.     Left lower leg: No edema.  Lymphadenopathy:     Cervical: No cervical adenopathy.  Skin:    General: Skin is warm and dry.     Findings: No rash.  Neurological:     Mental Status: She is alert and oriented to person, place, and time.      UC Treatments / Results  Labs (all labs ordered are listed, but only abnormal results are displayed) Labs Reviewed  POCT URINALYSIS DIP (MANUAL ENTRY) - Abnormal; Notable for the following components:      Result Value   Color, UA other (*)    Blood, UA trace-intact (*)    All other components within normal limits  URINE CULTURE  POCT CBC W AUTO DIFF ([facility]):  WBC 9.2; LY 17.5; MO 4.6; GR 77.9; Hgb 16.4; Platelets 253     EKG   Radiology No results found.  Procedures Procedures (including critical care time)  Medications Ordered in UC Medications - No data to display  Initial Impression / Assessment and Plan / UC Course  I have reviewed the triage vital signs and the nursing notes.  Pertinent labs & imaging results that were available during my care of the patient were reviewed by me and considered in my medical decision making (see chart for details).    Although WBC is normal, suspect early diverticulitis.  Begin Cipro and Flagyl for 5 days. Followup with Family Doctor if not improved in 8 days.    Final Clinical Impressions(s) / UC Diagnoses   Final diagnoses:  Dysuria  Abdominal pain, left lower quadrant     Discharge Instructions       Begin clear liquids for about 24 hours, then may begin a BRAT diet (Bananas, Rice, Applesauce, Toast) when symptoms have improved. Then gradually advance to a regular diet as tolerated.    May take Tylenol as needed for pain.  If symptoms become significantly worse during the night or over the weekend, proceed to the local emergency room.     ED Prescriptions    Medication Sig Dispense Auth. Provider   ciprofloxacin (CIPRO) 500 MG tablet Take one tab PO Q12hr 10 tablet Lattie Haw, MD   metroNIDAZOLE (FLAGYL) 500 MG tablet Take one tab PO Q8hr 15 tablet Lattie Haw, MD        Lattie Haw, MD 09/26/19 1118

## 2019-09-23 NOTE — ED Triage Notes (Signed)
Abdominal pain and cramping x 2 weeks  [place] & was very dehydrated   Pt states she is not sure if it is a UTI  Pain is across lower abd Ibuprofen 400mg  today at 1100 COVID vaccine Pfizer April

## 2019-09-23 NOTE — Discharge Instructions (Signed)
Begin clear liquids for about 24 hours, then may begin a [employer] (Bananas, Rice, Applesauce, Toast) when symptoms have improved. Then gradually advance to a regular diet as tolerated.    May take Tylenol as needed for pain.  If symptoms become significantly worse during the night or over the weekend, proceed to the local emergency room.

## 2019-09-24 LAB — URINE CULTURE
MICRO NUMBER:: 10667959
SPECIMEN QUALITY:: ADEQUATE

## 2021-08-17 ENCOUNTER — Emergency Department
Admission: RE | Admit: 2021-08-17 | Discharge: 2021-08-17 | Disposition: A | Discharge status: Home / Self Care | Payer: BC Managed Care – PPO | Source: Ambulatory Visit

## 2021-08-17 VITALS — BP 124/91 | HR 99 | Temp 98.5°F | Resp 14

## 2021-08-17 DIAGNOSIS — J0100 Acute maxillary sinusitis, unspecified: Secondary | ICD-10-CM

## 2021-08-17 DIAGNOSIS — J3489 Other specified disorders of nose and nasal sinuses: Secondary | ICD-10-CM | POA: Diagnosis not present

## 2021-08-17 MED ORDER — PREDNISONE 20 MG PO TABS: ORAL_TABLET | ORAL | 0 refills | Status: DC

## 2021-08-17 MED ORDER — DOXYCYCLINE HYCLATE 100 MG PO CAPS: 100.0000 mg | ORAL_CAPSULE | Freq: Two times a day (BID) | ORAL | 0 refills | Status: DC

## 2021-08-17 NOTE — ED Triage Notes (Signed)
Pt presents with c/o nasal congestion and otalgia with facial pain x 2 weeks

## 2021-08-17 NOTE — Discharge Instructions (Addendum)
Instructed patient to take medication as directed with food to completion.  Advised patient to take Prednisone with first dose of Doxycycline for the next 5 of 10 days.  Encouraged patient to increase daily water intake while taking these medications.  Advised patient if symptoms worsen and/or unresolved please follow-up with PCP or here for further evaluation.

## 2021-08-17 NOTE — ED Provider Notes (Signed)
[name]    CSN: [id number] Arrival date & time: 08/17/21  1239      History   Chief Complaint Chief Complaint  Patient presents with   Headache    I think I have a sinus infection. Had a bad cold over two weeks ago and headache, face pain, congestion, ear pain, etc. persist. - Entered by patient    HPI Emma Alvarez is a 56 y.o. female.   HPI 56 year old female presents with headache, sinus nasal congestion, otalgia and facial pain for 2 weeks.  Patient reports she believes she has a sinus infection.  PMH significant for asthma and diverticulitis  Past Medical History:  Diagnosis Date   Asthma    Diverticulitis     Patient Active Problem List   Diagnosis Date Noted   Obesity (BMI 30-39.9) 07/14/2018   Mild intermittent asthma 05/10/2012   Perennial allergic rhinitis 05/10/2012   Acute sinusitis 11/12/2010   ANEMIA, IRON DEFICIENCY 07/12/2008   DYSLIPIDEMIA 07/11/2008   MENORRHAGIA 07/11/2008   WEIGHT GAIN 07/11/2008    Past Surgical History:  Procedure Laterality Date   CESAREAN SECTION     CHOLECYSTECTOMY     DILATION AND CURETTAGE OF UTERUS      OB History   No obstetric history on file.      Home Medications    Prior to Admission medications   Medication Sig Start Date End Date Taking? Authorizing Provider  doxycycline (VIBRAMYCIN) 100 MG capsule Take 1 capsule (100 mg total) by mouth 2 (two) times daily for 10 days. 08/17/21 08/27/21 Yes Eliezer Lofts, FNP  predniSONE (DELTASONE) 20 MG tablet Take 3 tabs PO daily x 5 days. 08/17/21  Yes Eliezer Lofts, FNP  acetic acid-hydrocortisone (VOSOL-HC) otic solution Place 6 drops in the right ear twice daily Patient not taking: Reported on 08/17/2021 03/01/12   Kandra Nicolas, MD  albuterol (PROVENTIL) (2.5 MG/3ML) 0.083% nebulizer solution Take 2.5 mg by nebulization every 6 (six) hours as needed.    [provider]  beclomethasone (QVAR REDIHALER) 80 MCG/ACT inhaler INHALE ONE PUFF  INTO THE LUNGS 2 (TWO) TIMES DAILY. 01/30/14   [provider]  fluticasone (FLONASE) 50 MCG/ACT nasal spray Place 2 sprays into both nostrils daily. 01/30/14   [provider]  levonorgestrel-ethinyl estradiol (SEASONALE) 0.15-0.03 MG tablet Take 1 tablet by mouth daily. Patient not taking: Reported on 08/17/2021 11/11/10 11/11/11  Bowen, Collene Leyden, DO  loratadine (CLARITIN) 10 MG tablet Take by mouth.    [provider]    Family History Family History  Problem Relation Age of Onset   Healthy Mother    Heart failure Father    Healthy Sister    Healthy Sister     Social History Social History   Tobacco Use   Smoking status: Never   Smokeless tobacco: Never  Substance Use Topics   Alcohol use: No   Drug use: No     Allergies   Penicillins   Review of Systems Review of Systems  HENT:  Positive for congestion, ear pain, sinus pressure and sinus pain.   Neurological:  Positive for headaches.  All other systems reviewed and are negative.   Physical Exam Triage Vital Signs ED Triage Vitals  Enc Vitals Group     BP 08/17/21 1258 (!) 124/91     Pulse Rate 08/17/21 1258 99     Resp 08/17/21 1258 14     Temp 08/17/21 1258 98.5 F (36.9 C)  Temp Source 08/17/21 1258 Oral     SpO2 08/17/21 1258 96 %     Weight --      Height --      Head Circumference --      Peak Flow --      Pain Score 08/17/21 1300 2     Pain Loc --      Pain Edu? --      Excl. in [place]? --    No data found.  Updated Vital Signs BP (!) 124/91 (BP Location: Right Arm)   Pulse 99   Temp 98.5 F (36.9 C) (Oral)   Resp 14   SpO2 96%       Physical Exam Vitals and nursing note reviewed.  Constitutional:      General: She is not in acute distress.    Appearance: Normal appearance. She is obese. She is not ill-appearing.  HENT:     Head: Normocephalic and atraumatic.     Right Ear: Tympanic membrane and external ear normal.     Left Ear: Tympanic membrane and  external ear normal.     Ears:     Comments: Moderate eustachian tube dysfunction noted bilaterally    Nose:     Right Sinus: Maxillary sinus tenderness present.     Left Sinus: Maxillary sinus tenderness present.     Comments: Turbinates are erythematous/edematous    Mouth/Throat:     Mouth: Mucous membranes are moist.     Pharynx: Oropharynx is clear.     Comments: Moderate amount of clear drainage of posterior oropharynx noted Eyes:     Extraocular Movements: Extraocular movements intact.     Conjunctiva/sclera: Conjunctivae normal.     Pupils: Pupils are equal, round, and reactive to light.  Cardiovascular:     Rate and Rhythm: Normal rate and regular rhythm.     Pulses: Normal pulses.     Heart sounds: Normal heart sounds.  Pulmonary:     Effort: Pulmonary effort is normal.     Breath sounds: Normal breath sounds. No wheezing, rhonchi or rales.  Musculoskeletal:     Cervical back: Normal range of motion and neck supple.  Skin:    General: Skin is warm and dry.  Neurological:     General: No focal deficit present.     Mental Status: She is alert and oriented to person, place, and time.     UC Treatments / Results  Labs (all labs ordered are listed, but only abnormal results are displayed) Labs Reviewed - No data to display  EKG   Radiology No results found.  Procedures Procedures (including critical care time)  Medications Ordered in UC Medications - No data to display  Initial Impression / Assessment and Plan / UC Course  I have reviewed the triage vital signs and the nursing notes.  Pertinent labs & imaging results that were available during my care of the patient were reviewed by me and considered in my medical decision making (see chart for details).     MDM: 1.  Acute maxillary sinusitis, recurrence not specified-Rx'd Doxycycline; 2.  Sinus pressure-Rx'd prednisone. Instructed patient to take medication as directed with food to completion.  Advised  patient to take Prednisone with first dose of Doxycycline for the next 5 of 10 days.  Encouraged patient to increase daily water intake while taking these medications.  Advised patient if symptoms worsen and/or unresolved please follow-up with PCP or here for further evaluation.  Patient discharged home, hemodynamically stable.  Final Clinical Impressions(s) / UC Diagnoses   Final diagnoses:  Acute maxillary sinusitis, recurrence not specified  Sinus pressure     Discharge Instructions      Instructed patient to take medication as directed with food to completion.  Advised patient to take Prednisone with first dose of Doxycycline for the next 5 of 10 days.  Encouraged patient to increase daily water intake while taking these medications.  Advised patient if symptoms worsen and/or unresolved please follow-up with PCP or here for further evaluation.     ED Prescriptions     Medication Sig Dispense Auth. Provider   doxycycline (VIBRAMYCIN) 100 MG capsule Take 1 capsule (100 mg total) by mouth 2 (two) times daily for 10 days. 20 capsule Eliezer Lofts, FNP   predniSONE (DELTASONE) 20 MG tablet Take 3 tabs PO daily x 5 days. 15 tablet Eliezer Lofts, FNP      PDMP not reviewed this encounter.   Eliezer Lofts, [place] 08/17/21 1336

## 2021-08-23 ENCOUNTER — Emergency Department
Admission: EM | Admit: 2021-08-23 | Discharge: 2021-08-23 | Disposition: A | Discharge status: Home / Self Care | Payer: BC Managed Care – PPO | Source: Home / Self Care

## 2021-08-23 ENCOUNTER — Emergency Department: Admit: 2021-08-23 | Payer: Self-pay

## 2021-08-23 ENCOUNTER — Other Ambulatory Visit: Payer: Self-pay

## 2021-08-23 DIAGNOSIS — R200 Anesthesia of skin: Secondary | ICD-10-CM

## 2021-08-23 DIAGNOSIS — R519 Headache, unspecified: Secondary | ICD-10-CM | POA: Diagnosis not present

## 2021-08-23 DIAGNOSIS — H9193 Unspecified hearing loss, bilateral: Secondary | ICD-10-CM

## 2021-08-23 DIAGNOSIS — H9313 Tinnitus, bilateral: Secondary | ICD-10-CM | POA: Diagnosis not present

## 2021-08-23 NOTE — Discharge Instructions (Signed)
Please call ENT first thing tomorrow to schedule an appointment.  Also follow-up with your PCP as soon as possible.  Continue Tylenol ibuprofen for pain.  Take allergy medication to help with congestion.  Continue your antibiotic as scheduled.  If anything worsens at all including severe headache, dizziness, nausea/vomiting, weakness, change in sensation you need to go immediately to the emergency room as we discussed.

## 2021-08-23 NOTE — ED Provider Notes (Signed)
[name]    CSN: [id number] Arrival date & time: 08/23/21  1441      History   Chief Complaint Chief Complaint  Patient presents with   Headache   Tinnitus   Numbness    HPI Emma Alvarez is a 56 y.o. female.   Patient presents today for follow-up of 1 week history of headache and sinus pressure.  She has also been experiencing some left-sided facial numbness particularly of her left upper teeth for the last week but attributed this to the pressure from the sinuses.  She was seen by our clinic with a several day history of sinus pressure and pain on 08/17/2021.  She was started on doxycycline and continued to have symptoms prompting her to be evaluated in the emergency room on 08/20/2021.  At that time, no head imaging was obtained and doxycycline was discontinued and she was started on Levaquin.  She has continued to take this medication but still has severe symptoms prompting evaluation today.  She reports ongoing facial numbness particularly on the left side involving her upper teeth.  She reports bilateral decreased hearing and tinnitus described as a high-pitched ringing sound.  She denies any focal weakness, dysarthria, nausea/vomiting, ataxia, facial asymmetry.  She denies history of migraines or neurological condition; reports she had 1 severe headache when she was a child but denies formal diagnosis of migraines.  Headache pain is rated 5 on a 0-10 pain scale, localized to left ear and sinus, described as sharp, no aggravating relieving factors identified.  She denies history of sinus surgery and has not seen an ENT in the past.  She does have a history of hyperlipidemia but denies history of hypertension, lipidemia, smoking.   Past Medical History:  Diagnosis Date   Asthma    Diverticulitis     Patient Active Problem List   Diagnosis Date Noted   Obesity (BMI 30-39.9) 07/14/2018   Mild intermittent asthma 05/10/2012   Perennial allergic rhinitis 05/10/2012    Acute sinusitis 11/12/2010   ANEMIA, IRON DEFICIENCY 07/12/2008   DYSLIPIDEMIA 07/11/2008   MENORRHAGIA 07/11/2008   WEIGHT GAIN 07/11/2008    Past Surgical History:  Procedure Laterality Date   CESAREAN SECTION     CHOLECYSTECTOMY     DILATION AND CURETTAGE OF UTERUS      OB History   No obstetric history on file.      Home Medications    Prior to Admission medications   Medication Sig Start Date End Date Taking? Authorizing Provider  albuterol (PROVENTIL) (2.5 MG/3ML) 0.083% nebulizer solution Take 2.5 mg by nebulization every 6 (six) hours as needed.    [provider]  beclomethasone (QVAR REDIHALER) 80 MCG/ACT inhaler INHALE ONE PUFF INTO THE LUNGS 2 (TWO) TIMES DAILY. 01/30/14   [provider]  fluticasone (FLONASE) 50 MCG/ACT nasal spray Place 2 sprays into both nostrils daily. 01/30/14   [provider]  levofloxacin (LEVAQUIN) 250 MG tablet Take 750 mg by mouth daily. 08/21/21   [provider]  loratadine (CLARITIN) 10 MG tablet Take by mouth.    [provider]    Family History Family History  Problem Relation Age of Onset   Healthy Mother    Heart failure Father    Healthy Sister    Healthy Sister     Social History Social History   Tobacco Use   Smoking status: Never   Smokeless tobacco: Never  Vaping Use   Vaping Use: Never used  Substance  Use Topics   Alcohol use: No   Drug use: No     Allergies   Penicillins   Review of Systems Review of Systems  Constitutional:  Positive for activity change. Negative for appetite change, fatigue and fever.  HENT:  Positive for congestion, ear pain, hearing loss, sinus pressure and tinnitus. Negative for sneezing and sore throat.   Respiratory:  Negative for cough and shortness of breath.   Cardiovascular:  Negative for chest pain.  Gastrointestinal:  Negative for abdominal pain, diarrhea, nausea and vomiting.  Neurological:  Positive for headaches. Negative  for dizziness, tremors, syncope, facial asymmetry, speech difficulty, weakness, light-headedness and numbness.    Physical Exam Triage Vital Signs ED Triage Vitals  Enc Vitals Group     BP 08/23/21 1511 (!) 153/99     Pulse Rate 08/23/21 1511 96     Resp 08/23/21 1511 20     Temp 08/23/21 1511 98.2 F (36.8 C)     Temp Source 08/23/21 1511 Oral     SpO2 08/23/21 1511 97 %     Weight 08/23/21 1507 190 lb (86.2 kg)     Height 08/23/21 1507 5\' 7"  (1.702 m)     Head Circumference --      Peak Flow --      Pain Score 08/23/21 1506 5     Pain Loc --      Pain Edu? --      Excl. in GC? --    No data found.  Updated Vital Signs BP (!) 148/98 (BP Location: Left Arm)   Pulse 96   Temp 98.2 F (36.8 C) (Oral)   Resp 20   Ht 5\' 7"  (1.702 m)   Wt 190 lb (86.2 kg)   SpO2 97%   BMI 29.76 kg/m   Visual Acuity Right Eye Distance:   Left Eye Distance:   Bilateral Distance:    Right Eye Near:   Left Eye Near:    Bilateral Near:     Physical Exam Vitals reviewed.  Constitutional:      General: She is awake. She is not in acute distress.    Appearance: Normal appearance. She is well-developed. She is not ill-appearing.     Comments: Very pleasant female appears stated age in no acute distress sitting on exam table  HENT:     Head: Normocephalic and atraumatic. No raccoon eyes, Battle's sign or contusion.     Right Ear: Ear canal and external ear normal. A middle ear effusion is present. No hemotympanum. Tympanic membrane is bulging. Tympanic membrane is not erythematous.     Left Ear: Ear canal and external ear normal. A middle ear effusion is present. No hemotympanum. Tympanic membrane is bulging. Tympanic membrane is not erythematous.     Nose:     Right Sinus: Maxillary sinus tenderness present.     Left Sinus: Maxillary sinus tenderness present.     Mouth/Throat:     Tongue: Tongue does not deviate from midline.     Pharynx: Uvula midline. No oropharyngeal exudate or  posterior oropharyngeal erythema.  Eyes:     Extraocular Movements: Extraocular movements intact.     Conjunctiva/sclera: Conjunctivae normal.     Pupils: Pupils are equal, round, and reactive to light.  Cardiovascular:     Rate and Rhythm: Normal rate and regular rhythm.     Heart sounds: Normal heart sounds, S1 normal and S2 normal. No murmur heard. Pulmonary:     Effort: Pulmonary effort is  normal.     Breath sounds: Normal breath sounds. No wheezing, rhonchi or rales.     Comments: Clear to auscultation bilaterally Musculoskeletal:     Cervical back: Normal range of motion and neck supple. No spinous process tenderness or muscular tenderness.     Comments: Strength 5/5 bilateral upper and lower extremities, normal pincer and grip strength.  Lymphadenopathy:     Head:     Right side of head: No submental, submandibular or tonsillar adenopathy.     Left side of head: No submental, submandibular or tonsillar adenopathy.  Skin:    Findings: No rash.  Neurological:     General: No focal deficit present.     Mental Status: She is alert and oriented to person, place, and time.     Cranial Nerves: Cranial nerves 2-12 are intact.     Sensory: Sensation is intact.     Motor: Motor function is intact.     Coordination: Coordination is intact.     Gait: Gait is intact.     Comments: Cranial nerves II through XII grossly intact.  Normal sharp dull sensation.  No focal neurological defect on exam.  Psychiatric:        Behavior: Behavior is cooperative.     UC Treatments / Results  Labs (all labs ordered are listed, but only abnormal results are displayed) Labs Reviewed - No data to display  EKG   Radiology No results found.  Procedures Procedures (including critical care time)  Medications Ordered in UC Medications - No data to display  Initial Impression / Assessment and Plan / UC Course  I have reviewed the triage vital signs and the nursing notes.  Pertinent labs &  imaging results that were available during my care of the patient were reviewed by me and considered in my medical decision making (see chart for details).     Unclear etiology of symptoms.  Patient is well-appearing with nonfocal neuro exam in clinic.  Discussed that we do not have imaging capabilities in urgent care to fully investigate her symptoms.  Given she has been symptomatic for over a week we will defer emergent evaluation and recommended close outpatient follow up. Symptoms maybe related to severe sinus infection and recommended she complete course of Levaquin that was prescribed by the ER. She is to follow up with ENT ASAP and was given contact information for local provider with instruction to call and schedule an appointment first thing tomorrow (08/24/2021). Also recommended follow up with PCP as soon as possible. Discussed potential utility of steroids to help manage symptoms, however, given she is on a fluoroquinolone we cannot combine corticosteroids with this medication and so we will defer corticosteroids for the time being in favor of completing previously prescribed antibiotics.  Discussed that if she has any changing or worsening symptoms she needs to go directly to the emergency room to which she expressed understanding.  Discussed alarm symptoms that would warrant emergent evaluation including worsening headache, focal weakness, dysarthria, nausea/vomiting, confusion, dizziness, ataxia to which she expressed understanding.  Recommended that she have a low threshold for going to the ER.  Discussed the importance of outpatient follow-up and recommended that she follow-up ASAP with PCP and specialist.  All questions were answered to patient satisfaction.  Final Clinical Impressions(s) / UC Diagnoses   Final diagnoses:  Sinus headache  Facial numbness  Decreased hearing of both ears  Tinnitus of both ears     Discharge Instructions  Please call ENT first thing tomorrow to  schedule an appointment.  Also follow-up with your PCP as soon as possible.  Continue Tylenol ibuprofen for pain.  Take allergy medication to help with congestion.  Continue your antibiotic as scheduled.  If anything worsens at all including severe headache, dizziness, nausea/vomiting, weakness, change in sensation you need to go immediately to the emergency room as we discussed.    ED Prescriptions   None    PDMP not reviewed this encounter.   Terrilee Croak, PA-C 08/23/21 1615

## 2021-08-23 NOTE — ED Triage Notes (Signed)
Pt presents to Urgent Care with c/o severe headache, tinnitus, and facial numbness x one week. Reports being seen at [facility] recently and treated for sinus infection. Also visited ED 3 days ago d/t worsening of the above s/s--reports they changed her antibiotics. Pt is lethargic and oriented x 4. Grips are strong and equal, moving all extremities. No numbness to anywhere except face. Smile symmetrical, tongue midline.
